# Patient Record
Sex: Female | Born: 1960 | Hispanic: Yes | Marital: Married | Smoking: Never smoker
Health system: Southern US, Community
[De-identification: ages and names within clinical notes are randomized; demographics above are authoritative.]

## PROBLEM LIST (undated history)

## (undated) DIAGNOSIS — E039 Hypothyroidism, unspecified: Secondary | ICD-10-CM

## (undated) DIAGNOSIS — E119 Type 2 diabetes mellitus without complications: Secondary | ICD-10-CM

## (undated) DIAGNOSIS — Z972 Presence of dental prosthetic device (complete) (partial): Secondary | ICD-10-CM

## (undated) HISTORY — PX: OVARIAN CYST SURGERY: SHX726

---

## 2014-04-24 ENCOUNTER — Ambulatory Visit
Admit: 2014-04-24 | Disposition: A | Discharge status: Home / Self Care | Payer: Self-pay | Attending: Primary Care | Admitting: Primary Care

## 2014-08-14 ENCOUNTER — Telehealth: Payer: Self-pay | Admitting: Gastroenterology

## 2014-08-14 DIAGNOSIS — Z1211 Encounter for screening for malignant neoplasm of colon: Secondary | ICD-10-CM

## 2014-08-14 NOTE — Telephone Encounter (Signed)
Colonoscopy triage °

## 2014-08-19 MED ORDER — NA SULFATE-K SULFATE-MG SULF 17.5-3.13-1.6 GM/177ML PO SOLN: 1.0000 | ORAL | Status: AC

## 2014-08-19 NOTE — Telephone Encounter (Signed)
Gastroenterology Pre-Procedure Review  Request Date: 10/03/2014 Requesting Physician: Mrs. Seward Carol, NP  PATIENT REVIEW QUESTIONS: The patient responded to the following health history questions as indicated:    1. Are you having any GI issues? yes (constipation) 2. Do you have a personal history of Polyps? no 3. Do you have a family history of Colon Cancer or Polyps? no 4. Diabetes Mellitus? Yes, but not taking medications yet. Diet controlled. 5. Joint replacements in the past 12 months?no 6. Major health problems in the past 3 months?no 7. Any artificial heart valves, MVP, or defibrillator?no    MEDICATIONS & ALLERGIES:    Patient reports the following regarding taking any anticoagulation/antiplatelet therapy:   Plavix, Coumadin, Eliquis, Xarelto, Lovenox, Pradaxa, Brilinta, or Effient? no Aspirin? no  Patient confirms/reports the following medications:  Senna 8.6 MG take 1-2 tabs before bedtime for constipation Colace 100 MG 1 tab twice daily for constipation Levothyroxine Sodium 75 MCG 1 tab daily Premarin 0.625 MG/GM Cream 1 gram PV nightly at bedtime two times per week for vaginal itching   Patient confirms/reports the following allergies:  No Known Drug Allergies    AUTHORIZATION INFORMATION Primary Insurance: BC/BS 1D#: [id number] Group #: [id number]  Secondary Insurance: 1D#: Group #:  SCHEDULE INFORMATION: Date: 10/03/2014 Time: Location: [facility]  Patient needs an interpreter please. I will send patient her paperwork by mail and prescription to [facility].

## 2014-08-19 NOTE — Telephone Encounter (Signed)
Can you contact patient for colon triage please, Spanish speaking Ok to pick any dates last week of sept At [facility] surg except Tuesday.

## 2014-08-19 NOTE — Telephone Encounter (Signed)
Called patient and was not able to leave a voicemail. I will try to call her later on today.

## 2014-08-20 ENCOUNTER — Other Ambulatory Visit: Payer: Self-pay

## 2014-09-30 ENCOUNTER — Encounter: Payer: Self-pay | Admitting: *Deleted

## 2014-09-30 NOTE — Discharge Instructions (Signed)
Anestesia general, cuidados posteriores °(General Anesthesia, Care After) °Siga estas instrucciones durante las próximas semanas. Estas indicaciones le proporcionan información acerca de cómo deberá cuidarse después del procedimiento. El médico también podrá darle instrucciones más específicas. El tratamiento se ha planificado de acuerdo a las prácticas médicas actuales, pero a veces se producen problemas. Comuníquese con el médico si tiene algún problema o tiene dudas después del procedimiento. °QUÉ ESPERAR DESPUÉS DEL PROCEDIMIENTO °Después del procedimiento es habitual experimentar: °· Somnolencia. °· Náuseas y vómitos. °INSTRUCCIONES PARA EL CUIDADO EN EL HOGAR °· Durante las primeras 24 horas luego de la anestesia general: °¨ Haga que una persona responsable se quede con usted. °¨ No conduzca un automóvil. Si está solo, no viaje en transporte público. °¨ No beba alcohol. °¨ No tome medicamentos que no le haya recetado su médico. °¨ No firme documentos importantes ni tome decisiones trascendentes. °¨ Puede reanudar su dieta y sus actividades normales según le haya indicado el médico. °· Cambie los vendajes (apósitos) tal como se le indicó. °· Si tiene preguntas o se le presenta algún problema relacionado con la anestesia general, comuníquese con el hospital y pida por el anestesista o anestesiólogo de guardia. °SOLICITE ATENCIÓN MÉDICA SI: °· Tiene náuseas y vómitos durante el día posterior a la anestesia. °· Le aparece una erupción cutánea. °SOLICITE ATENCIÓN MÉDICA DE INMEDIATO SI:  °· Tiene dificultad para respirar. °· Siente dolor en el pecho. °· Tiene algún problema alérgico. °Document Released: 12/20/2004 Document Revised: 12/25/2012 °ExitCare® Patient Information ©2015 Exit[employer]. This information is not intended to replace advice given to you by your health care provider. Make sure you discuss any questions you have with your health care provider. °. ° °

## 2014-10-01 ENCOUNTER — Encounter: Payer: Self-pay | Admitting: Anesthesiology

## 2014-10-03 ENCOUNTER — Ambulatory Visit
Admission: RE | Admit: 2014-10-03 | Payer: BLUE CROSS/BLUE SHIELD | Source: Ambulatory Visit | Admitting: Gastroenterology

## 2014-10-03 HISTORY — DX: Presence of dental prosthetic device (complete) (partial): Z97.2

## 2014-10-03 HISTORY — DX: Hypothyroidism, unspecified: E03.9

## 2014-10-03 SURGERY — COLONOSCOPY WITH PROPOFOL
Anesthesia: Choice

## 2015-01-01 ENCOUNTER — Telehealth: Payer: Self-pay | Admitting: Gastroenterology

## 2015-01-01 ENCOUNTER — Encounter: Payer: Self-pay | Admitting: *Deleted

## 2015-01-01 ENCOUNTER — Other Ambulatory Visit: Payer: Self-pay

## 2015-01-01 NOTE — Telephone Encounter (Signed)
colonoscopy

## 2015-01-08 NOTE — Discharge Instructions (Signed)
Anestesia general, adultos, cuidados posteriores °(General Anesthesia, Adult, Care After) °Siga estas instrucciones durante las próximas semanas. Estas indicaciones le proporcionan información acerca de cómo deberá cuidarse después del procedimiento. El médico también podrá darle instrucciones más específicas. El tratamiento se ha planificado de acuerdo a las prácticas médicas actuales, pero a veces se producen problemas. Comuníquese con el médico si tiene algún problema o tiene dudas después del procedimiento. °QUÉ ESPERAR DESPUÉS DEL PROCEDIMIENTO °Después del procedimiento es habitual experimentar: °· Somnolencia. °· Náuseas y vómitos. °INSTRUCCIONES PARA EL CUIDADO EN EL HOGAR °· Durante las primeras 24 horas luego de la anestesia general: °¨ Haga que una persona responsable se quede con usted. °¨ No conduzca un automóvil. Si está solo, no viaje en transporte público. °¨ No beba alcohol. °¨ No tome medicamentos que no le haya recetado su médico. °¨ No firme documentos importantes ni tome decisiones trascendentes. °¨ Puede reanudar su dieta y sus actividades normales según le haya indicado el médico. °· Cambie los vendajes (apósitos) tal como se le indicó. °· Si tiene preguntas o se le presenta algún problema relacionado con la anestesia general, comuníquese con el hospital y pida por el anestesista o anestesiólogo de guardia. °SOLICITE ATENCIÓN MÉDICA SI: °· Tiene náuseas y vómitos durante el día posterior a la anestesia. °· Le aparece una erupción cutánea. °SOLICITE ATENCIÓN MÉDICA DE INMEDIATO SI:  °· Tiene dificultad para respirar. °· Siente dolor en el pecho. °· Tiene algún problema alérgico. °  °Esta información no tiene como fin reemplazar el consejo del médico. Asegúrese de hacerle al médico cualquier pregunta que tenga. °  °Document Released: 12/20/2004 Document Revised: 01/10/2014 °Elsevier Interactive Patient Education ©2016 Elsevier Inc. ° °

## 2015-01-09 ENCOUNTER — Ambulatory Visit: Payer: BLUE CROSS/BLUE SHIELD | Admitting: Anesthesiology

## 2015-01-09 ENCOUNTER — Encounter
Admission: RE | Disposition: A | Discharge status: Home / Self Care | Payer: Self-pay | Source: Ambulatory Visit | Attending: Gastroenterology

## 2015-01-09 ENCOUNTER — Ambulatory Visit
Admission: RE | Admit: 2015-01-09 | Discharge: 2015-01-09 | Disposition: A | Discharge status: Home / Self Care | Payer: BLUE CROSS/BLUE SHIELD | Source: Ambulatory Visit | Attending: Gastroenterology | Admitting: Gastroenterology

## 2015-01-09 DIAGNOSIS — K641 Second degree hemorrhoids: Secondary | ICD-10-CM | POA: Insufficient documentation

## 2015-01-09 DIAGNOSIS — Z79890 Hormone replacement therapy (postmenopausal): Secondary | ICD-10-CM | POA: Insufficient documentation

## 2015-01-09 DIAGNOSIS — Z79899 Other long term (current) drug therapy: Secondary | ICD-10-CM | POA: Insufficient documentation

## 2015-01-09 DIAGNOSIS — E039 Hypothyroidism, unspecified: Secondary | ICD-10-CM | POA: Insufficient documentation

## 2015-01-09 DIAGNOSIS — Z1211 Encounter for screening for malignant neoplasm of colon: Secondary | ICD-10-CM | POA: Diagnosis present

## 2015-01-09 HISTORY — PX: COLONOSCOPY WITH PROPOFOL: SHX5780

## 2015-01-09 SURGERY — COLONOSCOPY WITH PROPOFOL
Anesthesia: General | Wound class: Contaminated

## 2015-01-09 MED ORDER — LIDOCAINE HCL (CARDIAC) 20 MG/ML IV SOLN
INTRAVENOUS | Status: DC | PRN
  Administered 2015-01-09: 50 mg via INTRAVENOUS

## 2015-01-09 MED ORDER — LACTATED RINGERS IV SOLN
INTRAVENOUS | Status: DC
Start: 2015-01-09 — End: 2015-01-09
  Administered 2015-01-09: 10:00:00 via INTRAVENOUS

## 2015-01-09 MED ORDER — LACTATED RINGERS IV SOLN: 500.0000 mL | INTRAVENOUS | Status: DC

## 2015-01-09 MED ORDER — PROPOFOL 10 MG/ML IV BOLUS
INTRAVENOUS | Status: DC | PRN
  Administered 2015-01-09 (×2): 20 mg via INTRAVENOUS
  Administered 2015-01-09: 10 mg via INTRAVENOUS
  Administered 2015-01-09 (×3): 20 mg via INTRAVENOUS
  Administered 2015-01-09: 50 mg via INTRAVENOUS

## 2015-01-09 MED ORDER — STERILE WATER FOR IRRIGATION IR SOLN
Status: DC | PRN
  Administered 2015-01-09: 11:00:00

## 2015-01-09 SURGICAL SUPPLY — 28 items

## 2015-01-09 NOTE — Op Note (Signed)
[facility] Gastroenterology Patient Name: Elaine Woods Procedure Date: 01/09/2015 10:23 AM MRN: [id number] Account #: [id number] Date of Birth: [birth year] Admit Type: Outpatient Age: 55 [facility] Gender: Female Note Status: Finalized Procedure:         Colonoscopy Indications:       Screening for colorectal malignant neoplasm Providers:         Midge Miniumarren Jahmad Petrich, MD Referring MD:      Dorcas McmurrayJessica A. Rubio (Referring MD) Medicines:         Propofol per Anesthesia Complications:     No immediate complications. Procedure:         Pre-Anesthesia Assessment:                    - Prior to the procedure, a History and Physical was                     performed, and patient medications and allergies were                     reviewed. The patient's tolerance of previous anesthesia                     was also reviewed. The risks and benefits of the procedure                     and the sedation options and risks were discussed with the                     patient. All questions were answered, and informed consent                     was obtained. Prior Anticoagulants: The patient has taken                     no previous anticoagulant or antiplatelet agents. ASA                     Grade Assessment: II - A patient with mild systemic                     disease. After reviewing the risks and benefits, the                     patient was deemed in satisfactory condition to undergo                     the procedure.                    After obtaining informed consent, the colonoscope was                     passed under direct vision. Throughout the procedure, the                     patient's blood pressure, pulse, and oxygen saturations                     were monitored continuously. The Olympus CF H180AL                     colonoscope (S#: [id number]) was introduced through the anus  and advanced to the the cecum, identified by appendiceal                     orifice and ileocecal valve. The colonoscopy was performed                     without difficulty. The patient tolerated the procedure                     well. The quality of the bowel preparation was excellent. Findings:      The perianal and digital rectal examinations were normal.      Non-bleeding internal hemorrhoids were found during retroflexion. The       hemorrhoids were Grade II (internal hemorrhoids that prolapse but reduce       spontaneously). Impression:        - Non-bleeding internal hemorrhoids.                    - No specimens collected. Recommendation:    - Repeat colonoscopy in 10 years for screening unless any                     change in family history or lower GI problems. Procedure Code(s): --- Professional ---                    [phone], Colonoscopy, flexible; diagnostic, including                     collection of specimen(s) by brushing or washing, when                     performed (separate procedure) Diagnosis Code(s): --- Professional ---                    Z12.11, Encounter for screening for malignant neoplasm of                     colon CPT copyright 2014 American Medical Association. All rights reserved. The codes documented in this report are preliminary and upon coder review may  be revised to meet current compliance requirements. Midge Minium, MD 01/09/2015 10:51:35 AM This report has been signed electronically. Number of Addenda: 0 Note Initiated On: 01/09/2015 10:23 AM Scope Withdrawal Time: 0 hours 6 minutes 40 seconds  Total Procedure Duration: 0 hours 13 minutes 10 seconds       [facility]

## 2015-01-09 NOTE — H&P (Signed)
  [facility]  [address], [place], [place] [address] Phone: [phone] Fax : [phone]  Primary Care Physician:  Freddy Finner, NP Primary Gastroenterologist:  Dr. Allen Norris  Pre-Procedure History & Physical: HPI:  Elaine Woods is a 55 y.o. female is here for a screening colonoscopy.   Past Medical History  Diagnosis Date  . Wears dentures     partial lower  . Hypothyroidism     Past Surgical History  Procedure Laterality Date  . Ovarian cyst surgery      Prior to Admission medications   Medication Sig Start Date End Date Taking? Authorizing Provider  docusate sodium (COLACE) 100 MG capsule Take 100 mg by mouth 2 (two) times daily.   Yes Historical Provider, MD  estrogens, conjugated, (PREMARIN) 0.625 MG tablet Take 0.625 mg by mouth daily. Take daily for 21 days then do not take for 7 days.   Yes Historical Provider, MD  levothyroxine (SYNTHROID, LEVOTHROID) 75 MCG tablet Take 75 mcg by mouth daily before breakfast.   Yes Historical Provider, MD  Na Sulfate-K Sulfate-Mg Sulf (SUPREP BOWEL PREP) SOLN Take 1 kit by mouth as directed. 08/19/14  Yes Lucilla Lame, MD  senna (SENOKOT) 8.6 MG TABS tablet Take 1 tablet by mouth.   Yes Historical Provider, MD    Allergies as of 01/01/2015  . (No Known Allergies)    History reviewed. No pertinent family history.  Social History   Social History  . Marital Status: Married    Spouse Name: N/A  . Number of Children: N/A  . Years of Education: N/A   Occupational History  . Not on file.   Social History Main Topics  . Smoking status: Never Smoker   . Smokeless tobacco: Not on file  . Alcohol Use: No  . Drug Use: Not on file  . Sexual Activity: Not on file   Other Topics Concern  . Not on file   Social History Narrative    Review of Systems: See HPI, otherwise negative ROS  Physical Exam: BP 136/78 mmHg  Pulse 64  Temp(Src) 97.7 F (36.5 C)  Resp 17  Ht '5\' 3"'$  (1.6 m)  Wt 154  lb (69.854 kg)  BMI 27.29 kg/m2  SpO2 100% General:   Alert,  pleasant and cooperative in NAD Head:  Normocephalic and atraumatic. Neck:  Supple; no masses or thyromegaly. Lungs:  Clear throughout to auscultation.    Heart:  Regular rate and rhythm. Abdomen:  Soft, nontender and nondistended. Normal bowel sounds, without guarding, and without rebound.   Neurologic:  Alert and  oriented x4;  grossly normal neurologically.  Impression/Plan: Elaine Woods is now here to undergo a screening colonoscopy.  Risks, benefits, and alternatives regarding colonoscopy have been reviewed with the patient.  Questions have been answered.  All parties agreeable.

## 2015-01-09 NOTE — Transfer of Care (Signed)
Immediate Anesthesia Transfer of Care Note  Patient: Elaine KansasMaria Dimas de Leretha Woods  Procedure(s) Performed: Procedure(s) with comments: COLONOSCOPY WITH PROPOFOL (N/A) - leave patient next to end interpreter has been requested  Patient Location: PACU  Anesthesia Type: General  Level of Consciousness: awake, alert  and patient cooperative  Airway and Oxygen Therapy: Patient Spontanous Breathing and Patient connected to supplemental oxygen  Post-op Assessment: Post-op Vital signs reviewed, Patient's Cardiovascular Status Stable, Respiratory Function Stable, Patent Airway and No signs of Nausea or vomiting  Post-op Vital Signs: Reviewed and stable  Complications: No apparent anesthesia complications

## 2015-01-09 NOTE — Anesthesia Procedure Notes (Signed)
Procedure Name: MAC Performed by: Daisy Lites Pre-anesthesia Checklist: Patient identified, Emergency Drugs available, Suction available, Timeout performed and Patient being monitored Patient Re-evaluated:Patient Re-evaluated prior to inductionOxygen Delivery Method: Nasal cannula Placement Confirmation: positive ETCO2       

## 2015-01-09 NOTE — Anesthesia Postprocedure Evaluation (Signed)
**Note Elaine-Identified via Obfuscation** Anesthesia Post Note  Patient: Elaine KansasMaria Woods Elaine [name]  Procedure(s) Performed: Procedure(s) (LRB): COLONOSCOPY WITH PROPOFOL (N/A)  Patient location during evaluation: PACU Anesthesia Type: MAC Level of consciousness: awake and alert Pain management: pain level controlled Vital Signs Assessment: post-procedure vital signs reviewed and stable Respiratory status: spontaneous breathing, nonlabored ventilation, respiratory function stable and patient connected to nasal cannula oxygen Cardiovascular status: blood pressure returned to baseline and stable Postop Assessment: no signs of nausea or vomiting Anesthetic complications: no    DANIEL D KOVACS

## 2015-01-09 NOTE — Anesthesia Preprocedure Evaluation (Signed)
Anesthesia Evaluation  Patient identified by MRN, date of birth, ID band Patient awake    Reviewed: Allergy & Precautions, H&P , NPO status , Patient's Chart, lab work & pertinent test results, reviewed documented beta blocker date and time   Airway Mallampati: I  TM Distance: >3 FB Neck ROM: full    Dental no notable dental hx.    Pulmonary neg pulmonary ROS,    Pulmonary exam normal breath sounds clear to auscultation       Cardiovascular Exercise Tolerance: Good negative cardio ROS   Rhythm:regular Rate:Normal     Neuro/Psych negative neurological ROS  negative psych ROS   GI/Hepatic negative GI ROS, Neg liver ROS,   Endo/Other  Hypothyroidism   Renal/GU negative Renal ROS  negative genitourinary   Musculoskeletal   Abdominal   Peds  Hematology negative hematology ROS (+)   Anesthesia Other Findings   Reproductive/Obstetrics negative OB ROS                             Anesthesia Physical Anesthesia Plan  ASA: II  Anesthesia Plan: General   Post-op Pain Management:    Induction:   Airway Management Planned:   Additional Equipment:   Intra-op Plan:   Post-operative Plan:   Informed Consent: I have reviewed the patients History and Physical, chart, labs and discussed the procedure including the risks, benefits and alternatives for the proposed anesthesia with the patient or authorized representative who has indicated his/her understanding and acceptance.     Plan Discussed with: CRNA  Anesthesia Plan Comments:         Anesthesia Quick Evaluation

## 2015-01-10 ENCOUNTER — Encounter: Payer: Self-pay | Admitting: Gastroenterology

## 2015-01-12 ENCOUNTER — Telehealth: Payer: Self-pay

## 2015-01-12 NOTE — Telephone Encounter (Signed)
Pt's daughter called on behalf of her mother. Mother doesn't speak [place]. Per pt, She started having epigastric pain on Saturday. The pain isn't constant, but comes and goes. No nausea, vomiting, fever, diarrhea or blood noted. Pt had a colonoscopy on Friday. No polyps or specimens were collected. Advised pt this could be residual cramping from the bowel prep kit. Advised pt to take Tylenol or Motrin and increase fluids to remove any solution left. Pt's daughter stated her mother hasn't been drinking a lot. Advised her if pain increases or worsens or she notices any blood starts running a fever, nausea or vomiting to go to the ER. This information was relay to pt and seemed to understand. Will call me back with any other questions.

## 2015-12-21 ENCOUNTER — Other Ambulatory Visit: Payer: Self-pay | Admitting: Primary Care

## 2015-12-21 DIAGNOSIS — Z1231 Encounter for screening mammogram for malignant neoplasm of breast: Secondary | ICD-10-CM

## 2016-01-26 ENCOUNTER — Ambulatory Visit: Payer: BLUE CROSS/BLUE SHIELD | Attending: Primary Care

## 2017-01-05 ENCOUNTER — Ambulatory Visit
Admission: RE | Admit: 2017-01-05 | Discharge: 2017-01-05 | Disposition: A | Discharge status: Home / Self Care | Payer: BLUE CROSS/BLUE SHIELD | Source: Ambulatory Visit | Attending: Primary Care | Admitting: Primary Care

## 2017-01-05 DIAGNOSIS — Z1231 Encounter for screening mammogram for malignant neoplasm of breast: Secondary | ICD-10-CM | POA: Insufficient documentation

## 2017-11-21 ENCOUNTER — Other Ambulatory Visit: Payer: Self-pay | Admitting: Primary Care

## 2017-11-21 DIAGNOSIS — Z1231 Encounter for screening mammogram for malignant neoplasm of breast: Secondary | ICD-10-CM

## 2019-07-29 ENCOUNTER — Other Ambulatory Visit: Payer: Self-pay | Admitting: Primary Care

## 2019-07-29 DIAGNOSIS — Z1231 Encounter for screening mammogram for malignant neoplasm of breast: Secondary | ICD-10-CM

## 2019-08-16 ENCOUNTER — Other Ambulatory Visit: Payer: Self-pay

## 2019-08-16 ENCOUNTER — Ambulatory Visit
Admission: RE | Admit: 2019-08-16 | Discharge: 2019-08-16 | Disposition: A | Discharge status: Home / Self Care | Payer: BC Managed Care – PPO | Source: Ambulatory Visit | Attending: Primary Care | Admitting: Primary Care

## 2019-08-16 DIAGNOSIS — Z1231 Encounter for screening mammogram for malignant neoplasm of breast: Secondary | ICD-10-CM | POA: Insufficient documentation

## 2020-10-15 ENCOUNTER — Other Ambulatory Visit: Payer: Self-pay | Admitting: Primary Care

## 2020-10-15 DIAGNOSIS — Z1231 Encounter for screening mammogram for malignant neoplasm of breast: Secondary | ICD-10-CM

## 2020-11-10 ENCOUNTER — Other Ambulatory Visit: Payer: Self-pay

## 2020-11-10 ENCOUNTER — Ambulatory Visit
Admission: RE | Admit: 2020-11-10 | Discharge: 2020-11-10 | Disposition: A | Discharge status: Home / Self Care | Payer: BC Managed Care – PPO | Source: Ambulatory Visit | Attending: Primary Care | Admitting: Primary Care

## 2020-11-10 DIAGNOSIS — Z1231 Encounter for screening mammogram for malignant neoplasm of breast: Secondary | ICD-10-CM | POA: Insufficient documentation

## 2021-09-21 ENCOUNTER — Other Ambulatory Visit: Payer: Self-pay | Admitting: Primary Care

## 2021-09-21 DIAGNOSIS — Z1231 Encounter for screening mammogram for malignant neoplasm of breast: Secondary | ICD-10-CM

## 2021-12-21 ENCOUNTER — Ambulatory Visit
Admission: RE | Admit: 2021-12-21 | Discharge: 2021-12-21 | Disposition: A | Discharge status: Home / Self Care | Payer: BC Managed Care – PPO | Source: Ambulatory Visit | Attending: Primary Care | Admitting: Primary Care

## 2021-12-21 DIAGNOSIS — Z1231 Encounter for screening mammogram for malignant neoplasm of breast: Secondary | ICD-10-CM | POA: Diagnosis present

## 2022-10-13 ENCOUNTER — Other Ambulatory Visit: Payer: Self-pay | Admitting: Primary Care

## 2022-10-13 DIAGNOSIS — Z1231 Encounter for screening mammogram for malignant neoplasm of breast: Secondary | ICD-10-CM

## 2023-01-24 ENCOUNTER — Ambulatory Visit
Admission: RE | Admit: 2023-01-24 | Discharge: 2023-01-24 | Disposition: A | Discharge status: Home / Self Care | Payer: BC Managed Care – PPO | Source: Ambulatory Visit | Attending: Primary Care | Admitting: Primary Care

## 2023-01-24 DIAGNOSIS — Z1231 Encounter for screening mammogram for malignant neoplasm of breast: Secondary | ICD-10-CM | POA: Insufficient documentation

## 2023-05-17 ENCOUNTER — Other Ambulatory Visit: Payer: Self-pay | Admitting: Primary Care

## 2023-05-17 DIAGNOSIS — Z1231 Encounter for screening mammogram for malignant neoplasm of breast: Secondary | ICD-10-CM

## 2023-06-20 IMAGING — MG MM DIGITAL SCREENING BILAT W/ TOMO AND CAD
8 series · 8 of 24 positions shown · non-contrast
Comparison: Previous exam(s).

CLINICAL DATA: Screening.

EXAM:
DIGITAL SCREENING BILATERAL MAMMOGRAM WITH TOMOSYNTHESIS AND CAD
TECHNIQUE: Bilateral screening digital craniocaudal and mediolateral oblique
mammograms were obtained. Bilateral screening digital breast
tomosynthesis was performed. The images were evaluated with
computer-aided detection.

[L CC synth-2D]
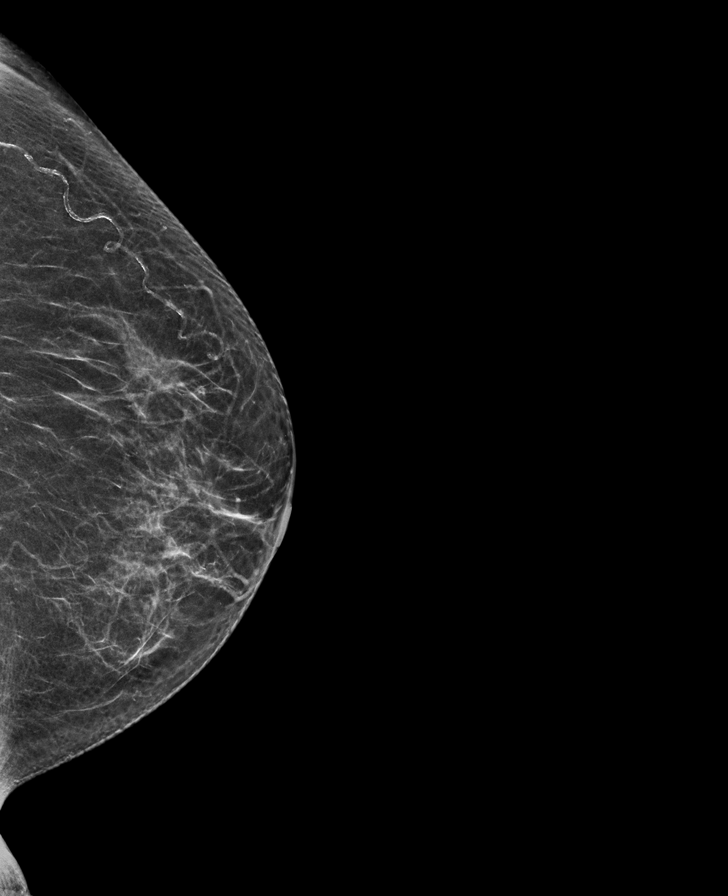

[R CC synth-2D]
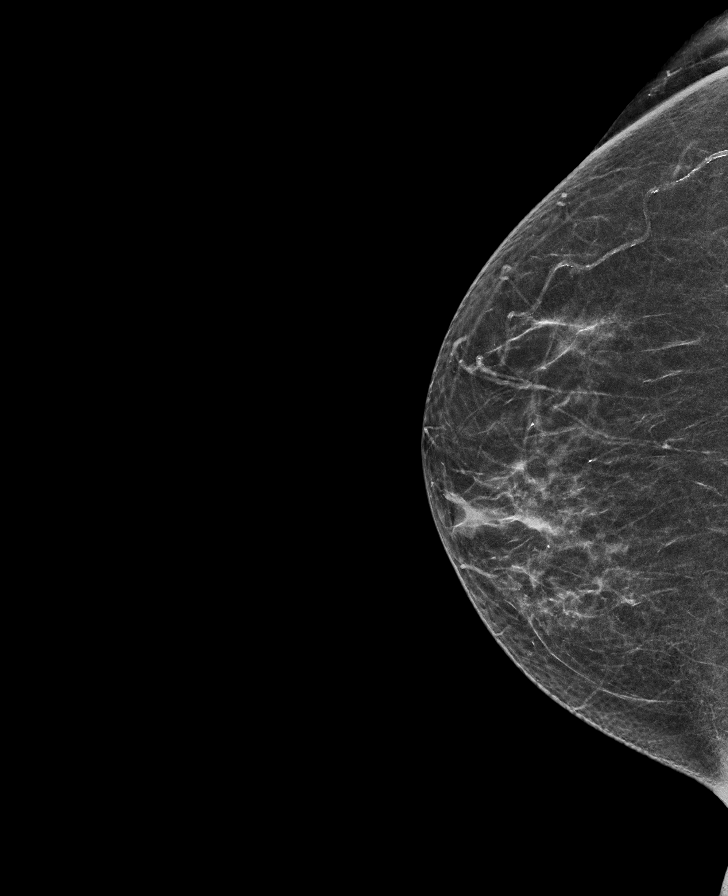

[R MLO synth-2D]
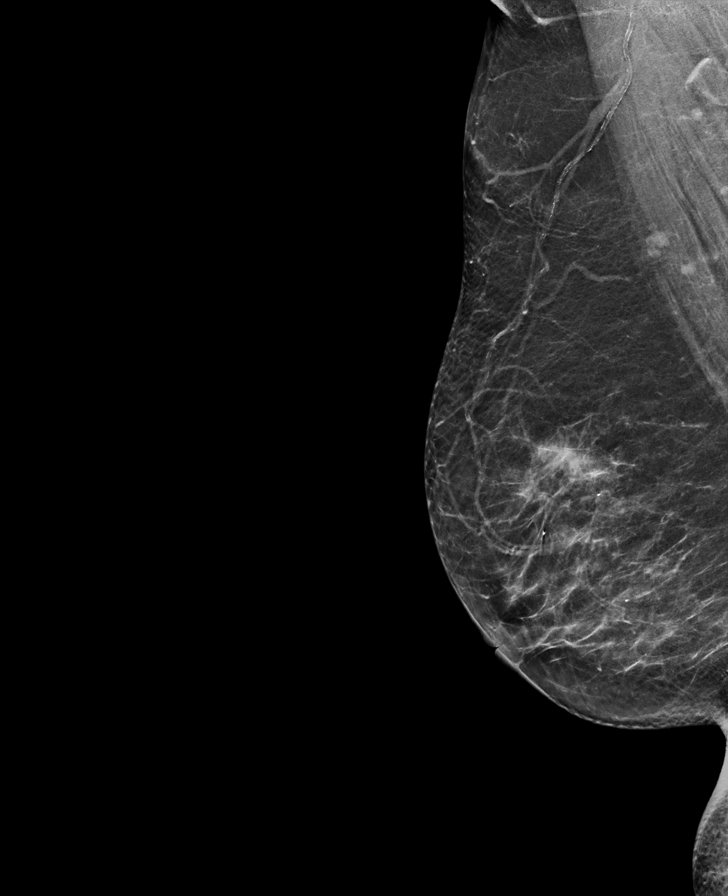

[L MLO synth-2D]
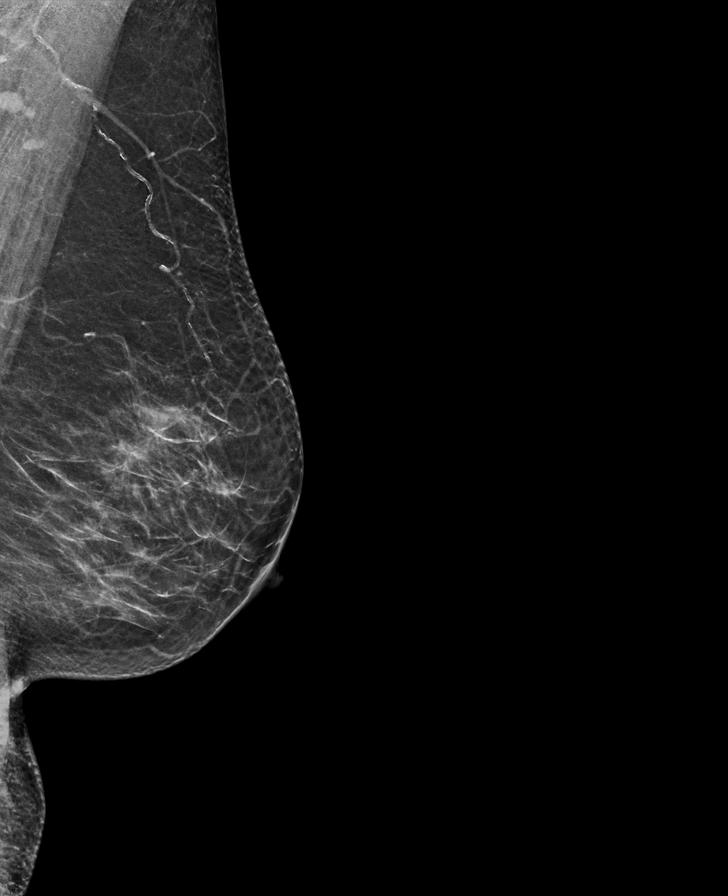

[L MLO tomo · tomo slice 29/57.0]
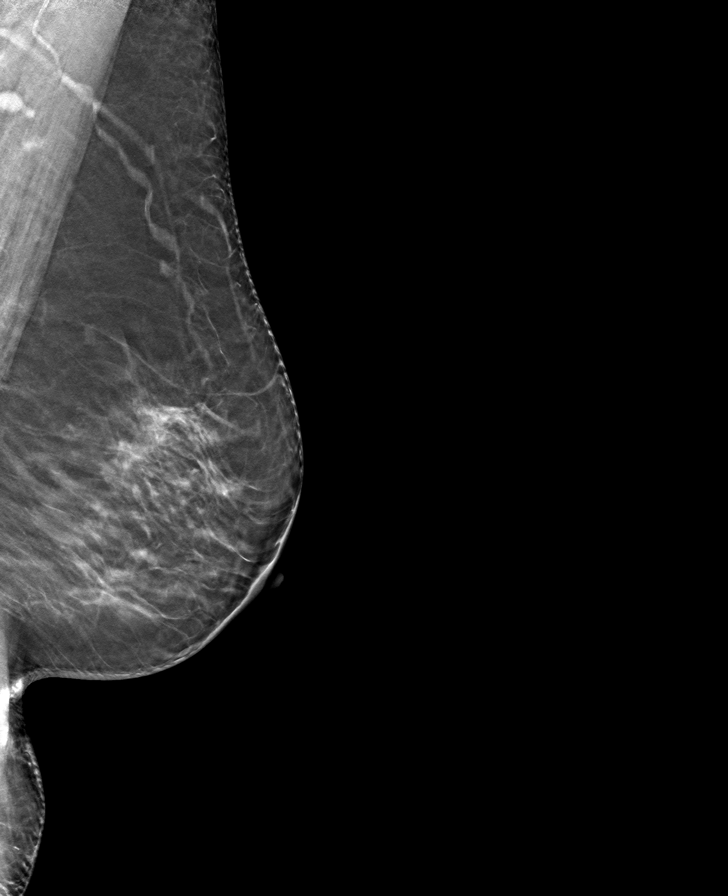

[L CC tomo · tomo slice 29/57.0]
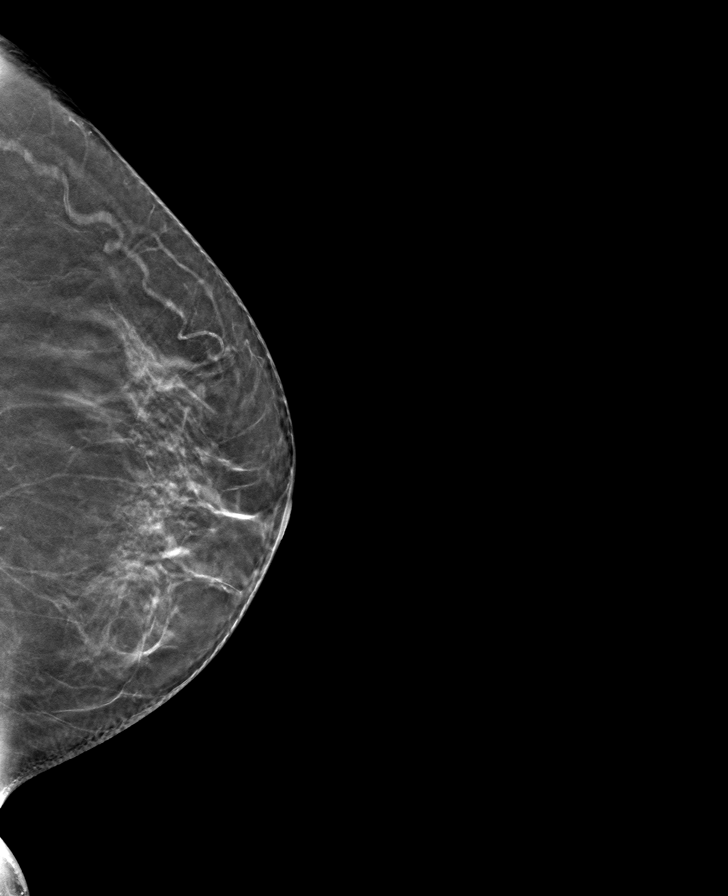

[R CC tomo · tomo slice 29/56.0]
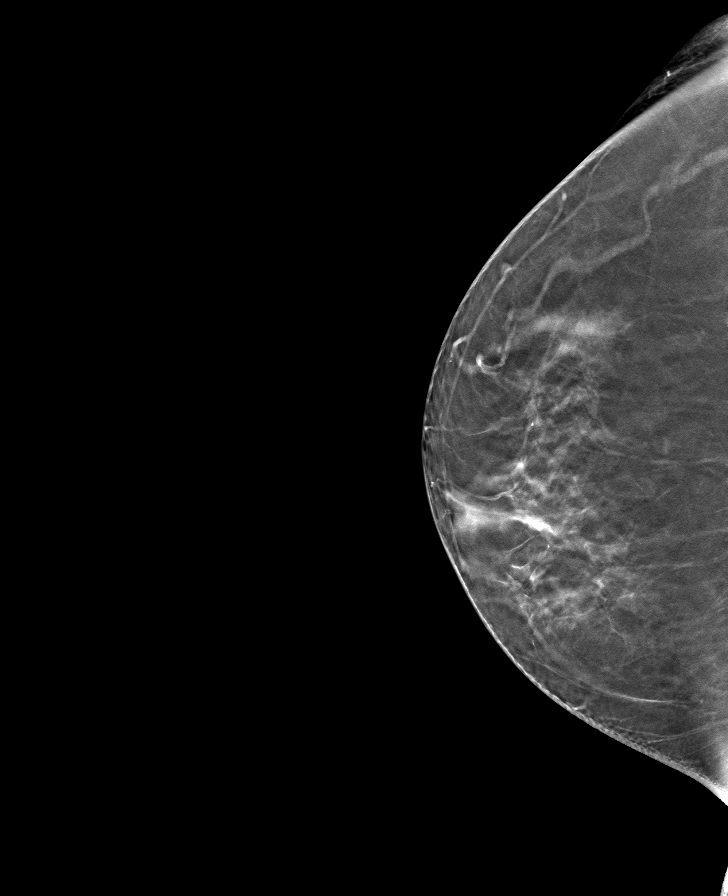

[R MLO tomo · tomo slice 31/61.0]
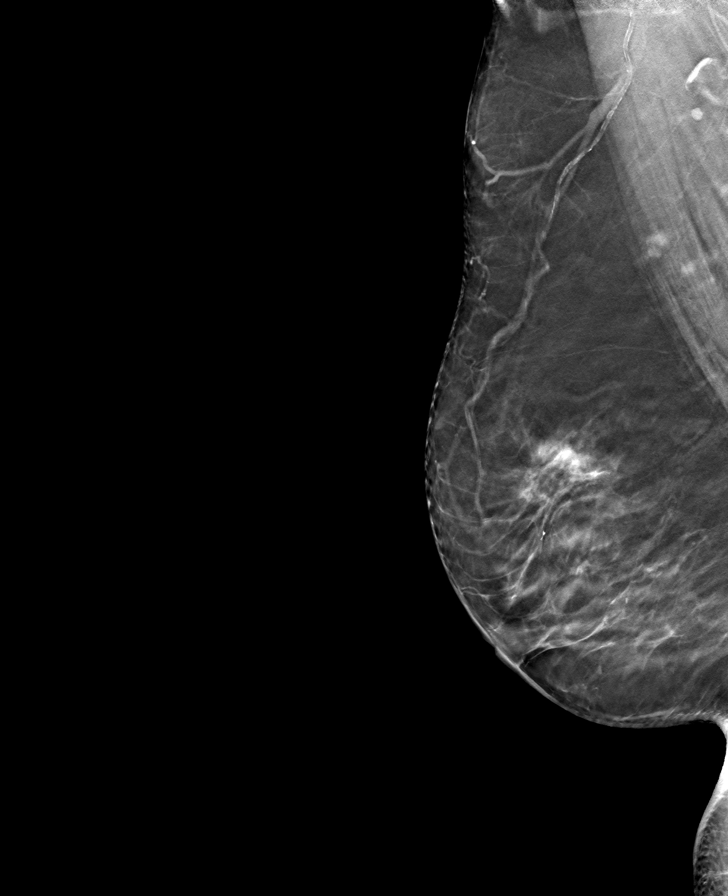

[8 of 24 positions shown; findings below may reference images not displayed]

ACR Breast Density Category b: There are scattered areas of
fibroglandular density.
FINDINGS: There are no findings suspicious for malignancy.
IMPRESSION: No mammographic evidence of malignancy. A result letter of this
screening mammogram will be mailed directly to the patient.

RECOMMENDATION:
Screening mammogram in one year. (Code:51-O-LD2)

BI-RADS CATEGORY  1: Negative.

## 2023-08-18 ENCOUNTER — Other Ambulatory Visit: Payer: Self-pay | Admitting: Primary Care

## 2023-08-18 ENCOUNTER — Ambulatory Visit
Admission: RE | Admit: 2023-08-18 | Discharge: 2023-08-18 | Disposition: A | Discharge status: Home / Self Care | Attending: Primary Care | Admitting: Primary Care

## 2023-08-18 ENCOUNTER — Ambulatory Visit
Admission: RE | Admit: 2023-08-18 | Discharge: 2023-08-18 | Disposition: A | Discharge status: Home / Self Care | Source: Ambulatory Visit | Attending: Primary Care | Admitting: Primary Care

## 2023-08-18 DIAGNOSIS — M25551 Pain in right hip: Secondary | ICD-10-CM

## 2023-09-23 ENCOUNTER — Emergency Department: Payer: Self-pay

## 2023-09-23 ENCOUNTER — Emergency Department
Admission: EM | Admit: 2023-09-23 | Discharge: 2023-09-23 | Disposition: A | Discharge status: Home / Self Care | Payer: Self-pay

## 2023-09-23 ENCOUNTER — Other Ambulatory Visit: Payer: Self-pay

## 2023-09-23 DIAGNOSIS — R110 Nausea: Secondary | ICD-10-CM

## 2023-09-23 DIAGNOSIS — R42 Dizziness and giddiness: Secondary | ICD-10-CM | POA: Insufficient documentation

## 2023-09-23 DIAGNOSIS — R112 Nausea with vomiting, unspecified: Secondary | ICD-10-CM | POA: Insufficient documentation

## 2023-09-23 DIAGNOSIS — N3000 Acute cystitis without hematuria: Secondary | ICD-10-CM | POA: Insufficient documentation

## 2023-09-23 HISTORY — DX: Type 2 diabetes mellitus without complications: E11.9

## 2023-09-23 LAB — COMPREHENSIVE METABOLIC PANEL WITH GFR
ALT: 27 U/L (ref 0–44)
AST: 34 U/L (ref 15–41)
Albumin: 4 g/dL (ref 3.5–5.0)
Alkaline Phosphatase: 67 U/L (ref 38–126)
Anion gap: 10 (ref 5–15)
BUN: 21 mg/dL (ref 8–23)
CO2: 25 mmol/L (ref 22–32)
Calcium: 8.8 mg/dL — ABNORMAL LOW (ref 8.9–10.3)
Chloride: 105 mmol/L (ref 98–111)
Creatinine, Ser: 0.78 mg/dL (ref 0.44–1.00)
GFR, Estimated: >60 mL/min (ref >60)
Glucose, Bld: 142 mg/dL — ABNORMAL HIGH (ref 70–99)
Potassium: 4.3 mmol/L (ref 3.5–5.1)
Sodium: 140 mmol/L (ref 135–145)
Total Bilirubin: 0.4 mg/dL (ref 0.0–1.2)
Total Protein: 7.7 g/dL (ref 6.5–8.1)

## 2023-09-23 LAB — CBC
HCT: 35.8 % — ABNORMAL LOW (ref 36.0–46.0)
Hemoglobin: 12.1 g/dL (ref 12.0–15.0)
MCH: 31.4 pg (ref 26.0–34.0)
MCHC: 33.8 g/dL (ref 30.0–36.0)
MCV: 93 fL (ref 80.0–100.0)
Platelets: 243 K/uL (ref 150–400)
RBC: 3.85 MIL/uL — ABNORMAL LOW (ref 3.87–5.11)
RDW: 13.3 % (ref 11.5–15.5)
WBC: 7.2 K/uL (ref 4.0–10.5)
nRBC: 0 % (ref 0.0–0.2)

## 2023-09-23 LAB — URINALYSIS, ROUTINE W REFLEX MICROSCOPIC
Bilirubin Urine: NEGATIVE
Glucose, UA: NEGATIVE mg/dL
Ketones, ur: NEGATIVE mg/dL
Leukocytes,Ua: NEGATIVE
Nitrite: POSITIVE — AB
Protein, ur: NEGATIVE mg/dL
Specific Gravity, Urine: 1.02 (ref 1.005–1.030)
pH: 6 (ref 5.0–8.0)

## 2023-09-23 LAB — LIPASE, BLOOD: Lipase: 42 U/L (ref 11–51)

## 2023-09-23 LAB — TROPONIN I (HIGH SENSITIVITY)
Troponin I (High Sensitivity): <2 ng/L (ref <18)
Troponin I (High Sensitivity): 4 ng/L (ref <18)

## 2023-09-23 MED ORDER — ONDANSETRON 4 MG PO TBDP: 4.0000 mg | ORAL_TABLET | Freq: Three times a day (TID) | ORAL | 0 refills | Status: AC | PRN

## 2023-09-23 MED ORDER — FAMOTIDINE IN NACL 20-0.9 MG/50ML-% IV SOLN
20.0000 mg | Freq: Once | INTRAVENOUS | Status: AC
  Administered 2023-09-23: 20 mg via INTRAVENOUS
  Filled 2023-09-23: qty 50

## 2023-09-23 MED ORDER — DIAZEPAM 5 MG/ML IJ SOLN
5.0000 mg | Freq: Once | INTRAMUSCULAR | Status: AC
  Administered 2023-09-23: 5 mg via INTRAVENOUS
  Filled 2023-09-23: qty 2

## 2023-09-23 MED ORDER — CEPHALEXIN 500 MG PO CAPS: 500.0000 mg | ORAL_CAPSULE | Freq: Four times a day (QID) | ORAL | 0 refills | Status: AC

## 2023-09-23 MED ORDER — SODIUM CHLORIDE 0.9 % IV SOLN
1.0000 g | Freq: Once | INTRAVENOUS | Status: AC
  Administered 2023-09-23: 1 g via INTRAVENOUS
  Filled 2023-09-23: qty 10

## 2023-09-23 MED ORDER — SODIUM CHLORIDE 0.9 % IV BOLUS
1000.0000 mL | Freq: Once | INTRAVENOUS | Status: AC
  Administered 2023-09-23: 1000 mL via INTRAVENOUS

## 2023-09-23 MED ORDER — IOHEXOL 350 MG/ML SOLN
75.0000 mL | Freq: Once | INTRAVENOUS | Status: AC | PRN
Start: 2023-09-23 — End: 2023-09-23
  Administered 2023-09-23: 75 mL via INTRAVENOUS

## 2023-09-23 MED ORDER — MECLIZINE HCL 25 MG PO TABS
25.0000 mg | ORAL_TABLET | Freq: Once | ORAL | Status: AC
Start: 2023-09-23 — End: 2023-09-23
  Administered 2023-09-23: 25 mg via ORAL
  Filled 2023-09-23: qty 1

## 2023-09-23 NOTE — Discharge Instructions (Signed)
 Le atendieron en urgencias por mareos y nuseas. Hoy le realizaron una evaluacin exhaustiva y fue tranquilizadora. Tiene indicios de una infeccin del tracto urinario. Su prxima dosis de antibiticos ser esta tarde. Por favor, llame y pida cita con su mdico de cabecera y regrese si presenta cualquier empeoramiento agudo de sus sntomas. -- You were seen in the emergency department for lightheadedness and nausea.  An extensive workup was taken today and was reassuring.  You do have evidence of a urinary tract infection.  Your next dose of antibiotics will be due this evening.  Please call and make an appointment with your primary care physician and return with any acutely worsening symptoms  PRECAUCIONES PARA VOLVER Y SEGUIMIENTO: (ESPAOL) PRECAUCIONES DE DEVOLUCIN: Regrese inmediatamente al departamento de emergencias o llame a su mdico si se siente peor, dbil, le falta el aliento, o tiene [place], vmitos, dolor, sangrado o heces oscuras, dificultad para Geographical information systems officer o cualquier problema nuevo. ATENCIN DE SEGUIMIENTO: Llame a su mdico y/o a los mdicos que lo referieron para obtener ms informacin y para Engineer, water cita. Haz esto hoy, maana o despus del fin de semana. Varios medicos solo toman un seguro PPO, si usted tiene un seguro HMO debe comunicarse con su HMO o su mdico primario para que lo envie a Music therapist de acuerdo a Psychologist, prison and probation services, de no ser asi tendria que pagar en efectivo por la consulta. Asegrese de decirles que fue una referencia del departamento de emergencias para que pueda obtener la cita ms pronto posible. Si su presin arterial fue superior a 120/80, vuelva a verificar su presin arterial dentro de 1 a 2 semanas. SUS RESULTADOS DE LOS ESTUDIOS: Lleve copias o informes de cualquier examen realizado hoy, incluidos anlisis de sangre u comoros, imgenes y Animal nutritionist, a su mdico y a Comptroller mdico de Mining engineer. Debe repetir las estudios anormales. Su mdico o un  mdico de referencia pueden informarle cundo se debe repetir [employer]. Adems, asegrese de que su mdico se comunique con este hospital para obtener otros resultados de [place], como laboratorios que an no se han realizado, e informes finales de imgenes, que pueden contener resultados importantes adicionales no documentados en el informe de Manufacturing engineer. CMO LLEGAR A CASA DE MANERA SEGURA: No conduzca, camine o tome el autobs a su hogar si ha recibido Facilities manager sedante, como medicamentos para la ansiedad o ciertos analgsicos o antihistamnicos como Benadryl. Si este es [place], guinea un taxi a casa o haga que un amigo lo lleve a su casa.

## 2023-09-23 NOTE — ED Notes (Signed)
 Pt was provided with UA cup.

## 2023-09-23 NOTE — ED Triage Notes (Signed)
 Pt to ED with husband for vertigo symptoms that started at 0300 when she woke up today. Pt got up to go to bathroom and was supporting herself on the walls. States when she turns her head, she feels like the room is spinning. Also around the same time she vomited several times and had some upper abdominal discomfort. Denies pain now. Is still dizzy. Is walking with steady gait.

## 2023-09-23 NOTE — ED Provider Notes (Signed)
 [facility] Provider Note    Event Date/Time   First MD Initiated Contact with Patient 09/23/23 [phone]     (approximate)   History   Emesis and Dizziness  Patient and has been over primarily Spanish-speaking only.  History is obtained using the iPad Spanish interpreter HPI  Elaine Woods is a 63 y.o. female with Sjogren syndrome who presents with vertigo and nausea and vomiting.  For the past week patient has intermittently been experiencing vertigo the last approximately an hour or 2 hours at a time but resolves.  She went to bed normal at 9 PM last night and awoke from bed with vertigo, nausea and had an episode of vomiting.  She reports that she attempted to walk to the bathroom but was unsteady but denies any fall.  She developed epigastric abdominal pain shortly thereafter afterwards but reports that this is subsiding.  She endorses a bitter taste in her mouth.  She reports the room spinning while lying in bed still but it is not as bad as what it was. Denies any chest pain or shortness of breath.  Denies any previous history of vertigo.  Denies any recent illness.  Her husband is at bedside and contributes to the history.      Physical Exam   Triage Vital Signs: ED Triage Vitals  Encounter Vitals Group     BP 09/23/23 0856 (!) 141/72     Girls Systolic BP Percentile --      Girls Diastolic BP Percentile --      Boys Systolic BP Percentile --      Boys Diastolic BP Percentile --      Pulse Rate 09/23/23 0856 61     Resp 09/23/23 0856 16     Temp 09/23/23 0856 98.3 F (36.8 C)     Temp Source 09/23/23 0856 Oral     SpO2 09/23/23 0856 100 %     Weight 09/23/23 0855 119 lb 0.8 oz (54 kg)     Height 09/23/23 0855 5' (1.524 m)     Head Circumference --      Peak Flow --      Pain Score 09/23/23 0853 0     Pain Loc --      Pain Education --      Exclude from Growth Chart --     Most recent vital signs: Vitals:   09/23/23 0856 09/23/23  1244  BP: (!) 141/72 (!) 130/55  Pulse: 61 61  Resp: 16 12  Temp: 98.3 F (36.8 C) 98.2 F (36.8 C)  SpO2: 100% 100%    Nursing Triage Note reviewed. Vital signs reviewed and patients oxygen saturation is normoxic  General: Patient is well nourished, well developed, awake and alert, resting comfortably in no acute distress Head: Normocephalic and atraumatic Eyes: Normal inspection, extraocular muscles intact, no conjunctival pallor Ear, nose, throat: Normal external exam Neck: Normal range of motion Respiratory: Patient is in no respiratory distress, lungs CTAB Cardiovascular: Patient is not tachycardic, RRR without murmur appreciated GI: Abd SNT with no guarding or rebound  Back: Normal inspection of the back with good strength and range of motion throughout all ext Extremities: pulses intact with good cap refills, no LE pitting edema or calf tenderness Neuro: The patient is alert and oriented to person, place, and time, appropriately conversive, with 5/5 bilat UE/LE strength, no gross motor or sensory defects noted.  Cranial nerves V and VII intact bilaterally, no dysmetria to finger-to-nose  or rapid hand Skin: Warm, dry, and intact Psych: normal mood and affect, no SI or HI  ED Results / Procedures / Treatments   Labs (all labs ordered are listed, but only abnormal results are displayed) Labs Reviewed  COMPREHENSIVE METABOLIC PANEL WITH GFR - Abnormal; Notable for the following components:      Result Value   Glucose, Bld 142 (*)    Calcium 8.8 (*)    All other components within normal limits  CBC - Abnormal; Notable for the following components:   RBC 3.85 (*)    HCT 35.8 (*)    All other components within normal limits  URINALYSIS, ROUTINE W REFLEX MICROSCOPIC - Abnormal; Notable for the following components:   Color, Urine YELLOW (*)    APPearance CLEAR (*)    Hgb urine dipstick MODERATE (*)    Nitrite POSITIVE (*)    Bacteria, UA RARE (*)    All other components  within normal limits  LIPASE, BLOOD  TROPONIN I (HIGH SENSITIVITY)  TROPONIN I (HIGH SENSITIVITY)     EKG EKG and rhythm strip are interpreted by myself:   EKG: [Normal sinus rhythm] at heart rate of 77, normal QRS duration, QTc 476, normal ST segments and T waves no ectopy EKG not consistent with Acute STEMI Rhythm strip: NSR in lead II   RADIOLOGY CT angio head and neck with iv contrast: No acute abnormality on my independent review interpretation and radiologist agrees MRI brian without contrast: No acute abnormality and no evidence of CVA   PROCEDURES:  Critical Care performed: No  Procedures   MEDICATIONS ORDERED IN ED: Medications  sodium chloride  0.9 % bolus 1,000 mL (0 mLs Intravenous Stopped 09/23/23 1245)  diazepam  (VALIUM ) injection 5 mg (5 mg Intravenous Given 09/23/23 1019)  meclizine  (ANTIVERT ) tablet 25 mg (25 mg Oral Given 09/23/23 1019)  famotidine  (PEPCID ) IVPB 20 mg premix (0 mg Intravenous Stopped 09/23/23 1121)  iohexol  (OMNIPAQUE ) 350 MG/ML injection 75 mL (75 mLs Intravenous Contrast Given 09/23/23 1038)  cefTRIAXone  (ROCEPHIN ) 1 g in sodium chloride  0.9 % 100 mL IVPB (0 g Intravenous Stopped 09/23/23 1325)     IMPRESSION / MDM / ASSESSMENT AND PLAN / ED COURSE                                Differential diagnosis includes, but is not limited to, posterior CVA, Mnire's disease, vestibular neuritis, electrolyte derangement, anemia arrhythmia  ED course: Patient presents symptomatic from vertigo which has been ongoing intermittently for several days and continuous since awaking this morning from sleep with last known normal at 9 PM.  I am concerned about the possibility of posterior CVA however she is certainly out of the window for any acute stroke alert.  Will treat symptomatically with IV fluids Valium  and meclizine .  Will obtain CT angio head and neck with and without contrast along with an MRI.  Will assess for improvement.  Disposition pending  diagnostics at this point  13:11: Patient was able to ambulate and felt improved.  Counseled patient using the Spanish interpreter that her workup today was negative for any CVA and she voiced understanding.  She did have evidence of a UTI and I did send 5 days of Keflex  to patient's pharmacy of choice.  She will follow-up with her primary care physician  Clinical Course as of 09/23/23 1530  Sat Sep 23, 2023  1011 WBC: 7.2 No leukocytosis [HD]  1011 Comprehensive metabolic panel(!) No elevated liver function tests or lipase [HD]  1011 Lipase: 42 [HD]  1202 CT ANGIO HEAD NECK W WO CM CT angio head and neck negative [HD]  1210 Nitrite(!): POSITIVE Positive will treat with a dose of antibiotics [HD]  1302 MR BRAIN WO CONTRAST No acute abnormality will discuss with patient using the Spanish interpreter [HD]  1311 I reviewed discharge instructions with the patient and her husband.  Patient feels much improved has ambulated and taken p.o.  Was sent home with antibiotics for UTI and Zofran .  She will follow-up with primary care physician all questions answered and patient voiced understanding [HD]    Clinical Course User Index [HD] Nicholaus Rolland BRAVO, MD   At time of discharge there is no evidence of acute life, limb, vision, or fertility threat. Patient has stable vital signs, pain is well controlled, patient is ambulatory and p.o. tolerant.  Discharge instructions were completed using the EPIC system. I would refer you to those at this time. All warnings prescriptions follow-up etc. were discussed in detail with the patient. Patient indicates understanding and is agreeable with this plan. All questions answered.  Patient is made aware that they may return to the emergency department for any worsening or new condition or for any other emergency.   -- Risk: 5 This patient has a high risk of morbidity due to further diagnostic testing or treatment. Rationale: This patient's evaluation and  management involve a high risk of morbidity due to the potential severity of presenting symptoms, need for diagnostic testing, and/or initiation of treatment that may require close monitoring. The differential includes conditions with potential for significant deterioration or requiring escalation of care. Treatment decisions in the ED, including medication administration, procedural interventions, or disposition planning, reflect this level of risk. COPA: 5 The patient has the following acute or chronic illness/injury that poses a possible threat to life or bodily function: [X] : The patient has a potentially serious acute condition or an acute exacerbation of a chronic illness requiring urgent evaluation and management in the Emergency Department. The clinical presentation necessitates immediate consideration of life-threatening or function-threatening diagnoses, even if they are ultimately ruled out.   FINAL CLINICAL IMPRESSION(S) / ED DIAGNOSES   Final diagnoses:  Dizziness  Acute cystitis without hematuria  Nausea     Rx / DC Orders   ED Discharge Orders          Ordered    cephALEXin  (KEFLEX ) 500 MG capsule  4 times daily        09/23/23 1313    ondansetron  (ZOFRAN -ODT) 4 MG disintegrating tablet  Every 8 hours PRN        09/23/23 1313             Note:  This document was prepared using Dragon voice recognition software and may include unintentional dictation errors.   Nicholaus Rolland BRAVO, MD 09/23/23 [phone]
